# Patient Record
Sex: Female | Born: 1990 | Race: Black or African American | Hispanic: No | Marital: Single | Smoking: Former smoker
Health system: Southern US, Community
[De-identification: ages and names within clinical notes are randomized; demographics above are authoritative.]

## PROBLEM LIST (undated history)

## (undated) ENCOUNTER — Inpatient Hospital Stay: Payer: Self-pay

## (undated) DIAGNOSIS — F319 Bipolar disorder, unspecified: Secondary | ICD-10-CM

## (undated) DIAGNOSIS — F32A Depression, unspecified: Secondary | ICD-10-CM

## (undated) DIAGNOSIS — F419 Anxiety disorder, unspecified: Secondary | ICD-10-CM

## (undated) DIAGNOSIS — F329 Major depressive disorder, single episode, unspecified: Secondary | ICD-10-CM

## (undated) HISTORY — PX: NO PAST SURGERIES: SHX2092

---

## 2014-03-26 NOTE — L&D Delivery Note (Signed)
Delivery Summary for Baelynn Quakenbush  Labor Events:   Preterm labor:   Rupture date:   Rupture time:   Rupture type: Spontaneous  Fluid Color: Pink  Induction:   Augmentation:   Complications:   Cervical ripening:          Delivery:   Episiotomy:   Lacerations:   Repair suture:   Repair # of packets:   Blood loss (ml): 250   Information for the patient's newborn:  Enzo MontgomeryBellamy, Boy Janayah [[id number]][[id number]]    Delivery 02/20/2015 5:10 PM by  Vaginal, Vacuum (Extractor) Sex:  female Gestational Age: 358w2d Delivery Clinician:  Hildred LaserAnika Jerusha Reising Living?: Yes        APGARS  One minute Five minutes Ten minutes  Skin color: 0   1      Heart rate: 2   2      Grimace: 2   2      Muscle tone: 2   2      Breathing: 1   2      Totals: 7  9      Presentation/position: Vertex  Left Occiput Anterior Resuscitation: None  Cord information: 3 vessels   Disposition of cord blood: No    Blood gases sent? No Complications: None  Placenta: Delivered: 02/20/2015 5:17 PM  Spontaneous  Intact appearance Newborn Measurements: Weight: 6 lb 13.9 oz (3115 g)  Height: 19.49"  Head circumference:    Chest circumference:    Other providers: Registered Nurse Transition RN Daniel NonesLetitia S Elks Myrtha MantisKiara K Jacobs  Additional  information: Forceps:   Vacuum: Low  Breech:   Observed anomalies         Operative Delivery Note At 5:10 PM a viable and healthy female was delivered via Vaginal, Vacuum (Extractor).  Presentation: vertex; Position: Left,, Occiput,, Anterior; Station: +3.  Indications: terminal bradycardia (60s).  Number of pop-offs: 1  Verbal consent: obtained from patient.  Risks and benefits discussed in detail.  Risks include, but are not limited to the risks of anesthesia, bleeding, infection, damage to maternal tissues, fetal cephalhematoma.  There is also the risk of inability to effect vaginal delivery of the head, or shoulder dystocia that cannot be resolved by established maneuvers, leading to the need  for emergency cesarean section.  APGAR: 7, 9; weight 3115 grams.   Placenta status: Intact, Spontaneous.   Cord: 3 vessels with the following complications: None.  Cord pH: not obtained  Anesthesia: Epidural  Instruments: Kiwi Vacuum Extractor Episiotomy: None Lacerations: 1st degree Suture Repair: 2.0 Vicryl Est. Blood Loss (mL):  250   Mom to postpartum.  Baby to Couplet care / Skin to Skin.  Hildred Lasernika Jacaden Forbush 02/20/2015, 5:43 PM

## 2014-09-15 LAB — OB RESULTS CONSOLE VARICELLA ZOSTER ANTIBODY, IGG: Varicella: IMMUNE

## 2014-09-15 LAB — OB RESULTS CONSOLE RUBELLA ANTIBODY, IGM: RUBELLA: IMMUNE

## 2014-09-15 LAB — OB RESULTS CONSOLE ABO/RH: RH Type: POSITIVE

## 2014-09-15 LAB — OB RESULTS CONSOLE HGB/HCT, BLOOD
HCT: 34 %
HEMOGLOBIN: 11.4 g/dL

## 2014-09-15 LAB — OB RESULTS CONSOLE HEPATITIS B SURFACE ANTIGEN: HEP B S AG: NEGATIVE

## 2014-09-15 LAB — OB RESULTS CONSOLE PLATELET COUNT: Platelets: 176 10*3/uL

## 2014-09-15 LAB — OB RESULTS CONSOLE HIV ANTIBODY (ROUTINE TESTING): HIV: NONREACTIVE

## 2014-09-15 LAB — OB RESULTS CONSOLE RPR: RPR: NONREACTIVE

## 2014-10-21 LAB — OB RESULTS CONSOLE GC/CHLAMYDIA
Chlamydia: NEGATIVE
Gonorrhea: NEGATIVE

## 2014-12-23 ENCOUNTER — Encounter: Payer: Self-pay | Admitting: *Deleted

## 2014-12-23 ENCOUNTER — Observation Stay
Admission: EM | Admit: 2014-12-23 | Discharge: 2014-12-23 | Disposition: A | Discharge status: Home / Self Care | Payer: 59 | Attending: Obstetrics and Gynecology | Admitting: Obstetrics and Gynecology

## 2014-12-23 DIAGNOSIS — Z3A30 30 weeks gestation of pregnancy: Secondary | ICD-10-CM | POA: Insufficient documentation

## 2014-12-23 DIAGNOSIS — O210 Mild hyperemesis gravidarum: Principal | ICD-10-CM | POA: Insufficient documentation

## 2014-12-23 LAB — URINALYSIS COMPLETE WITH MICROSCOPIC (ARMC ONLY)
BILIRUBIN URINE: NEGATIVE
Bacteria, UA: NONE SEEN
GLUCOSE, UA: NEGATIVE mg/dL
Hgb urine dipstick: NEGATIVE
Ketones, ur: NEGATIVE mg/dL
Leukocytes, UA: NEGATIVE
Nitrite: NEGATIVE
PH: 8 (ref 5.0–8.0)
Protein, ur: NEGATIVE mg/dL
Specific Gravity, Urine: 1.011 (ref 1.005–1.030)

## 2014-12-23 MED ORDER — BISMUTH SUBSALICYLATE 262 MG/15ML PO SUSP
30.0000 mL | Freq: Once | ORAL | Status: AC
  Administered 2014-12-23: 30 mL via ORAL
  Filled 2014-12-23: qty 118

## 2014-12-23 MED ORDER — ONDANSETRON 4 MG PO TBDP
4.0000 mg | ORAL_TABLET | Freq: Once | ORAL | Status: AC
Start: 2014-12-23 — End: 2014-12-23
  Administered 2014-12-23: 4 mg via ORAL
  Filled 2014-12-23: qty 1

## 2014-12-23 NOTE — OB Triage Note (Signed)
Hyperemesis during entire pregnancy. Hospitalized 3 times this pregnancy due to dehydration. Rebecca Hickman

## 2014-12-23 NOTE — Discharge Instructions (Signed)
Drink plenty of fluids. Stay hydrated. Call OB office to set up your prenatal care.

## 2015-01-26 ENCOUNTER — Encounter: Payer: Self-pay | Admitting: *Deleted

## 2015-01-26 ENCOUNTER — Observation Stay: Payer: 59

## 2015-01-26 ENCOUNTER — Observation Stay
Admission: EM | Admit: 2015-01-26 | Discharge: 2015-01-26 | Disposition: A | Discharge status: Home / Self Care | Payer: 59 | Attending: Obstetrics and Gynecology | Admitting: Obstetrics and Gynecology

## 2015-01-26 DIAGNOSIS — R112 Nausea with vomiting, unspecified: Secondary | ICD-10-CM | POA: Insufficient documentation

## 2015-01-26 DIAGNOSIS — Z3A34 34 weeks gestation of pregnancy: Secondary | ICD-10-CM | POA: Diagnosis not present

## 2015-01-26 DIAGNOSIS — O26893 Other specified pregnancy related conditions, third trimester: Secondary | ICD-10-CM | POA: Insufficient documentation

## 2015-01-26 DIAGNOSIS — O4400 Placenta previa specified as without hemorrhage, unspecified trimester: Secondary | ICD-10-CM

## 2015-01-26 HISTORY — DX: Major depressive disorder, single episode, unspecified: F32.9

## 2015-01-26 HISTORY — DX: Depression, unspecified: F32.A

## 2015-01-26 HISTORY — DX: Anxiety disorder, unspecified: F41.9

## 2015-01-26 HISTORY — DX: Bipolar disorder, unspecified: F31.9

## 2015-01-26 LAB — URINALYSIS COMPLETE WITH MICROSCOPIC (ARMC ONLY)
BILIRUBIN URINE: NEGATIVE
Bacteria, UA: NONE SEEN
Glucose, UA: NEGATIVE mg/dL
Hgb urine dipstick: NEGATIVE
KETONES UR: NEGATIVE mg/dL
Leukocytes, UA: NEGATIVE
NITRITE: NEGATIVE
PH: 8 (ref 5.0–8.0)
PROTEIN: NEGATIVE mg/dL
RBC / HPF: NONE SEEN RBC/hpf (ref 0–5)
SPECIFIC GRAVITY, URINE: 1.013 (ref 1.005–1.030)

## 2015-01-26 LAB — URINE DRUG SCREEN, QUALITATIVE (ARMC ONLY)
Amphetamines, Ur Screen: NOT DETECTED
BARBITURATES, UR SCREEN: NOT DETECTED
BENZODIAZEPINE, UR SCRN: NOT DETECTED
Cannabinoid 50 Ng, Ur ~~LOC~~: POSITIVE — AB
Cocaine Metabolite,Ur ~~LOC~~: NOT DETECTED
MDMA (Ecstasy)Ur Screen: NOT DETECTED
Methadone Scn, Ur: NOT DETECTED
OPIATE, UR SCREEN: NOT DETECTED
PHENCYCLIDINE (PCP) UR S: NOT DETECTED
Tricyclic, Ur Screen: NOT DETECTED

## 2015-01-26 MED ORDER — RANITIDINE HCL 150 MG PO TABS: 150.0000 mg | ORAL_TABLET | Freq: Two times a day (BID) | ORAL | Status: DC

## 2015-01-26 MED ORDER — TERBUTALINE SULFATE 1 MG/ML IJ SOLN
0.2500 mg | Freq: Once | INTRAMUSCULAR | Status: AC
  Administered 2015-01-26: 0.25 mg via SUBCUTANEOUS
  Filled 2015-01-26: qty 1

## 2015-01-26 MED ORDER — ONDANSETRON 4 MG PO TBDP: 4.0000 mg | ORAL_TABLET | Freq: Four times a day (QID) | ORAL | Status: DC | PRN

## 2015-01-26 NOTE — Discharge Instructions (Signed)
Please make an appointment with [facility] for next week (11/7)

## 2015-01-26 NOTE — OB Triage Note (Signed)
Complaining of increased nausea and vomiting for the last week with "stomach pains".
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
## 2015-01-26 NOTE — Final Progress Note (Signed)
Physician Final Progress Note  Patient ID: Rebecca Hickman Michelle MRN: [id number] DOB/AGE: Jun 08, 1990 24 y.o.  Admit date: 01/26/2015 Admitting provider: Herold HarmsMartin A Defrancesco, MD Discharge date: 01/26/2015   Admission Diagnoses:  1. 34.5 week intrauterine pregnancy 2. Nausea and vomiting 3. Preterm contractions  Discharge Diagnoses:  1. 34.5 week intrauterine pregnancy, undelivered 2. Nausea and vomiting 3. Preterm contractions   24 year old African-American female gravida 1 para 0 at 2734.[redacted] weeks gestation, with no regular prenatal care, [facility], currently homeless, presents to labor and delivery with complaint of nausea/vomiting and abdominal cramping. Patient had early ultrasound at 20 weeks which demonstrated marginal previa. Ultrasound today demonstrates no placenta previa. Fetal monitoring demonstrates irregular contractions every 5-10 minutes, mild Cervix is 1-2 cm/70%/0/vertex/BOWI Patient is given by mouth fluids and terbutaline subcutaneous 1 dose for suppression of uterine irritability. Patient is [facility] of prenatal care on 02/01/2015.  Consults: None  Significant Findings/ Diagnostic Studies: Ultrasound-fundal placenta without evidence of previa  Procedures:  NST-reactive Pelvic exam: 1-2 cm/70%/0/vertex/BOWI  Discharge Condition: good  Disposition: 01-Home or Self Care  Diet: Regular diet  Discharge Activity: Activity as tolerated  Discharge Instructions    Discharge patient    Complete by:  As directed             Medication List    TAKE these medications        ondansetron 4 MG disintegrating tablet  Commonly known as:  ZOFRAN ODT  Take 1 tablet (4 mg total) by mouth every 6 (six) hours as needed for nausea.     PRENATAL VITAMIN PO  Take by mouth.     ranitidine 150 MG tablet  Commonly known as:  ZANTAC  Take 1 tablet (150 mg total) by mouth  2 (two) times daily.           Follow-up Information    Follow up with Herold HarmsMartin A Defrancesco, MD. Schedule an appointment as soon as possible for a visit on 02/01/2015.   Specialties:  Obstetrics and Gynecology, Radiology   Why:  New OB visit   Contact information:   [address] [place] [id number] [phone]        Signed: Prentice DockerMartin A Defrancesco 01/26/2015, 4:59 PM

## 2015-01-26 NOTE — Discharge Planning (Signed)
Discharge instructions given and explained.  Verbalized understanding.  Signed copy to chart and one in hand.  Will remain on monitor until her ride arrives to take her home.

## 2015-01-26 NOTE — OB Triage Note (Signed)
Recvd with c/o n/v and abdominal pain that worsened this AM.  Changed to gown and to bed.  EFM applied.  Plan of care discussed and oriented to room.  Verbalized understanding and agreement with plan.

## 2015-01-29 LAB — CULTURE, BETA STREP (GROUP B ONLY)

## 2015-02-01 ENCOUNTER — Encounter: Payer: Self-pay | Admitting: *Deleted

## 2015-02-01 ENCOUNTER — Ambulatory Visit (INDEPENDENT_AMBULATORY_CARE_PROVIDER_SITE_OTHER): Payer: 59 | Admitting: Obstetrics and Gynecology

## 2015-02-01 ENCOUNTER — Inpatient Hospital Stay: Admit: 2015-02-01 | Payer: Self-pay

## 2015-02-01 ENCOUNTER — Inpatient Hospital Stay
Admission: EM | Admit: 2015-02-01 | Discharge: 2015-02-02 | Disposition: A | Discharge status: Home / Self Care | Payer: 59 | Attending: Obstetrics and Gynecology | Admitting: Obstetrics and Gynecology

## 2015-02-01 ENCOUNTER — Encounter: Payer: Self-pay | Admitting: Obstetrics and Gynecology

## 2015-02-01 VITALS — BP 103/68 | HR 85 | Wt 132.1 lb

## 2015-02-01 DIAGNOSIS — Z3490 Encounter for supervision of normal pregnancy, unspecified, unspecified trimester: Secondary | ICD-10-CM

## 2015-02-01 DIAGNOSIS — Z3403 Encounter for supervision of normal first pregnancy, third trimester: Secondary | ICD-10-CM

## 2015-02-01 DIAGNOSIS — Z113 Encounter for screening for infections with a predominantly sexual mode of transmission: Secondary | ICD-10-CM

## 2015-02-01 DIAGNOSIS — Z5900 Homelessness unspecified: Secondary | ICD-10-CM

## 2015-02-01 DIAGNOSIS — Z131 Encounter for screening for diabetes mellitus: Secondary | ICD-10-CM | POA: Diagnosis not present

## 2015-02-01 DIAGNOSIS — Z1389 Encounter for screening for other disorder: Secondary | ICD-10-CM

## 2015-02-01 DIAGNOSIS — Z3A36 36 weeks gestation of pregnancy: Secondary | ICD-10-CM | POA: Insufficient documentation

## 2015-02-01 LAB — OB RESULTS CONSOLE GBS: STREP GROUP B AG: NEGATIVE

## 2015-02-01 LAB — POCT URINALYSIS DIPSTICK
BILIRUBIN UA: NEGATIVE
Blood, UA: NEGATIVE
Glucose, UA: NEGATIVE
KETONES UA: NEGATIVE
LEUKOCYTES UA: NEGATIVE
NITRITE UA: NEGATIVE
PH UA: 6
Protein, UA: NEGATIVE
Spec Grav, UA: 1.02
Urobilinogen, UA: NEGATIVE

## 2015-02-01 LAB — OB RESULTS CONSOLE GC/CHLAMYDIA: Chlamydia: NEGATIVE

## 2015-02-01 MED ORDER — TERBUTALINE SULFATE 1 MG/ML IJ SOLN
0.2500 mg | Freq: Once | INTRAMUSCULAR | Status: AC
  Administered 2015-02-02: 0.25 mg via SUBCUTANEOUS

## 2015-02-01 NOTE — Progress Notes (Deleted)
ER f/u for n/v, preterm contractions. Some contractions, noted front of stomach. Morning N&V. NONSTRESS TEST INTERPRETATION  INDICATIONS: {Blank multiple:19196::"Decreased fetal movement","Chronic hypertension","Preeclampsia," GDM","Obesity"," AMA"," IUGR","***"}  FHR baseline: *** RESULTS: reactive COMMENTS: ***   PLAN: 1. Continue fetal kick counts twice a day. 2. Continue antepartum testing as scheduled-weekly 3.  F/U weekly  Fenton Mallingebbie Narcissus Detwiler, LPN

## 2015-02-01 NOTE — Progress Notes (Signed)
New OB intake: 24 year old single African-American fem gravida 1, para 0 at 35.[redacted] weeks gestation, presents for new OB intake evaluation.  She was seen in labor and delivery last week for nausea, vomiting in the third trimester.  She was homeless [place].  Currently, she does have a  Place to stay.  She reports good fetal movement.  She had prenatal care early in her pregnancy.  At no clots.  [place].  She has no known active problems.  Noted marginalhas resolved on most recent ultrasound one week ago. Past medical history: No chronic illnesses. Past surgical history: None. Family history: Negative. Social history: No tobacco use; no alcohol use; no drug use.  Patient currently staying in homeless shelter. Lab work: A+/antibody screen negative/RPR nonreactive/hepatitis B negative/HIV negative/rubella immune/Varicela immune/urine culture negative/hemoglobin electrophoresis- normal hemoglobin//GBS negative (culture last week). PLAN: 1.  36 week labs today. 2.  NST-Reactive 3.  One hour Glucola. 4.  Follow up weekly.

## 2015-02-02 DIAGNOSIS — Z3A36 36 weeks gestation of pregnancy: Secondary | ICD-10-CM | POA: Diagnosis not present

## 2015-02-02 LAB — GLUCOSE, 1 HOUR GESTATIONAL: Gestational Diabetes Screen: 103 mg/dL (ref 65–139)

## 2015-02-02 MED ORDER — TERBUTALINE SULFATE 1 MG/ML IJ SOLN
INTRAMUSCULAR | Status: AC
  Administered 2015-02-02: 0.25 mg via SUBCUTANEOUS
  Filled 2015-02-02: qty 1

## 2015-02-02 NOTE — Discharge Summary (Signed)
Discharge instructions reviewed with patient including follow up appointments, signs of preterm labor and when to seek medical attention. All questions answered. Patient discharged in stable condition without signs of distress.

## 2015-02-03 LAB — GC/CHLAMYDIA PROBE AMP
Chlamydia trachomatis, NAA: NEGATIVE
NEISSERIA GONORRHOEAE BY PCR: NEGATIVE

## 2015-02-06 ENCOUNTER — Observation Stay
Admission: EM | Admit: 2015-02-06 | Discharge: 2015-02-06 | Disposition: A | Discharge status: Home / Self Care | Payer: 59 | Attending: Obstetrics and Gynecology | Admitting: Obstetrics and Gynecology

## 2015-02-06 DIAGNOSIS — Z3A36 36 weeks gestation of pregnancy: Secondary | ICD-10-CM | POA: Diagnosis not present

## 2015-02-06 NOTE — OB Triage Note (Signed)
Pt. reports lots of fetal movement and contractions "all day." Denies LOF or vaginal bleeding.

## 2015-02-07 NOTE — Final Progress Note (Signed)
L&D OB Triage Note  SUBJECTIVE Rebecca Hickman is a 24 y.o. G1P0000 female at 6041w3d, EDD Estimated Date of Delivery: 03/04/15 who presented to triage with complaints of contractions.    OBJECTIVE Nursing Evaluation: BP 111/66 mmHg  Pulse 103  Temp(Src) 97.8 F (36.6 C) (Oral)  Resp 14 no significant findings-irregular contractions on monitor; patient declined exam.  NST was performed and has been reviewed by me.  NST INTERPRETATION: Indications: rule out uterine contractions  Mode: External Baseline Rate (A): 130 bpm Variability: Moderate Accelerations: 15 x 15 Decelerations: None     Contraction Frequency (min): irregular  ASSESSMENT Impression:  1. Pregnancy:  G1P0000 at 4941w3d , EDD Estimated Date of Delivery: 03/04/15 2.  Reactive NST  PLAN 1. Reassurance given 2. Discharge home with bleeding/labor precautions.  3. Continue routine prenatal care.   Herold HarmsMartin A Kebra Lowrimore, MD

## 2015-02-08 ENCOUNTER — Ambulatory Visit (INDEPENDENT_AMBULATORY_CARE_PROVIDER_SITE_OTHER): Payer: 59 | Admitting: Obstetrics and Gynecology

## 2015-02-08 ENCOUNTER — Encounter: Payer: Self-pay | Admitting: Obstetrics and Gynecology

## 2015-02-08 VITALS — BP 108/70 | HR 87 | Wt 136.0 lb

## 2015-02-08 DIAGNOSIS — Z3493 Encounter for supervision of normal pregnancy, unspecified, third trimester: Secondary | ICD-10-CM

## 2015-02-08 LAB — POCT URINALYSIS DIPSTICK
BILIRUBIN UA: NEGATIVE
GLUCOSE UA: NEGATIVE
Nitrite, UA: NEGATIVE
Protein, UA: NEGATIVE
RBC UA: NEGATIVE
SPEC GRAV UA: 1.015
UROBILINOGEN UA: 0.2
pH, UA: 7

## 2015-02-08 NOTE — Progress Notes (Signed)
Declines u/a and exam.- Pt is very upset. Wants to be induced. States she cant feed her baby. Denied food stamps. Pt given number to [facility]. Labor precautions. Will see if pt qualifies for Pregnancy Medical Home as [place].

## 2015-02-10 ENCOUNTER — Encounter: Payer: 59 | Admitting: Obstetrics and Gynecology

## 2015-02-16 ENCOUNTER — Ambulatory Visit (INDEPENDENT_AMBULATORY_CARE_PROVIDER_SITE_OTHER): Payer: 59 | Admitting: Obstetrics and Gynecology

## 2015-02-16 ENCOUNTER — Observation Stay
Admission: EM | Admit: 2015-02-16 | Discharge: 2015-02-17 | Disposition: A | Discharge status: Home / Self Care | Payer: 59 | Source: Home / Self Care | Admitting: Obstetrics and Gynecology

## 2015-02-16 ENCOUNTER — Encounter: Payer: Self-pay | Admitting: Obstetrics and Gynecology

## 2015-02-16 VITALS — BP 111/69 | HR 106 | Wt 138.4 lb

## 2015-02-16 DIAGNOSIS — R109 Unspecified abdominal pain: Secondary | ICD-10-CM | POA: Insufficient documentation

## 2015-02-16 DIAGNOSIS — O26893 Other specified pregnancy related conditions, third trimester: Secondary | ICD-10-CM | POA: Insufficient documentation

## 2015-02-16 DIAGNOSIS — Z3490 Encounter for supervision of normal pregnancy, unspecified, unspecified trimester: Secondary | ICD-10-CM

## 2015-02-16 DIAGNOSIS — Z3493 Encounter for supervision of normal pregnancy, unspecified, third trimester: Secondary | ICD-10-CM

## 2015-02-16 DIAGNOSIS — Z3A38 38 weeks gestation of pregnancy: Secondary | ICD-10-CM | POA: Insufficient documentation

## 2015-02-16 LAB — POCT URINALYSIS DIPSTICK
Bilirubin, UA: NEGATIVE
GLUCOSE UA: NEGATIVE
Ketones, UA: NEGATIVE
NITRITE UA: NEGATIVE
PROTEIN UA: NEGATIVE
RBC UA: NEGATIVE
Spec Grav, UA: 1.01
UROBILINOGEN UA: 0.2
pH, UA: 7.5

## 2015-02-16 NOTE — OB Triage Note (Signed)
Pt arrived via ED with abdominal pain and irregular contractions. [facility] and questions addressed. Will cont to update.

## 2015-02-16 NOTE — Progress Notes (Signed)
Declines pelvic exam. Labor precautions.

## 2015-02-17 DIAGNOSIS — Z3490 Encounter for supervision of normal pregnancy, unspecified, unspecified trimester: Secondary | ICD-10-CM

## 2015-02-17 NOTE — Discharge Instructions (Signed)
Pt education given. Please drink plenty of water and get plenty of rest.

## 2015-02-19 ENCOUNTER — Encounter: Payer: Self-pay | Admitting: *Deleted

## 2015-02-19 ENCOUNTER — Observation Stay
Admission: EM | Admit: 2015-02-19 | Discharge: 2015-02-20 | Disposition: A | Discharge status: Home / Self Care | Payer: 59 | Source: Home / Self Care | Admitting: Obstetrics and Gynecology

## 2015-02-19 DIAGNOSIS — Z3A38 38 weeks gestation of pregnancy: Secondary | ICD-10-CM

## 2015-02-19 DIAGNOSIS — O26893 Other specified pregnancy related conditions, third trimester: Secondary | ICD-10-CM

## 2015-02-19 DIAGNOSIS — R109 Unspecified abdominal pain: Secondary | ICD-10-CM | POA: Insufficient documentation

## 2015-02-20 ENCOUNTER — Inpatient Hospital Stay: Payer: 59 | Admitting: Anesthesiology

## 2015-02-20 ENCOUNTER — Encounter: Payer: Self-pay | Admitting: *Deleted

## 2015-02-20 ENCOUNTER — Encounter: Payer: Self-pay | Admitting: Anesthesiology

## 2015-02-20 ENCOUNTER — Inpatient Hospital Stay
Admission: EM | Admit: 2015-02-20 | Discharge: 2015-02-22 | DRG: 775 | Disposition: A | Discharge status: Home / Self Care | Payer: 59 | Attending: Obstetrics and Gynecology | Admitting: Obstetrics and Gynecology

## 2015-02-20 DIAGNOSIS — Z818 Family history of other mental and behavioral disorders: Secondary | ICD-10-CM

## 2015-02-20 DIAGNOSIS — Z87891 Personal history of nicotine dependence: Secondary | ICD-10-CM | POA: Diagnosis not present

## 2015-02-20 DIAGNOSIS — O9902 Anemia complicating childbirth: Secondary | ICD-10-CM | POA: Diagnosis present

## 2015-02-20 DIAGNOSIS — Z3A38 38 weeks gestation of pregnancy: Secondary | ICD-10-CM

## 2015-02-20 DIAGNOSIS — Z5900 Homelessness unspecified: Secondary | ICD-10-CM

## 2015-02-20 DIAGNOSIS — D696 Thrombocytopenia, unspecified: Secondary | ICD-10-CM | POA: Diagnosis present

## 2015-02-20 DIAGNOSIS — O4292 Full-term premature rupture of membranes, unspecified as to length of time between rupture and onset of labor: Secondary | ICD-10-CM | POA: Diagnosis present

## 2015-02-20 DIAGNOSIS — Z3490 Encounter for supervision of normal pregnancy, unspecified, unspecified trimester: Secondary | ICD-10-CM

## 2015-02-20 DIAGNOSIS — O9912 Other diseases of the blood and blood-forming organs and certain disorders involving the immune mechanism complicating childbirth: Secondary | ICD-10-CM | POA: Diagnosis present

## 2015-02-20 DIAGNOSIS — R109 Unspecified abdominal pain: Secondary | ICD-10-CM | POA: Diagnosis present

## 2015-02-20 LAB — URINALYSIS COMPLETE WITH MICROSCOPIC (ARMC ONLY)
Bacteria, UA: NONE SEEN
Bilirubin Urine: NEGATIVE
Glucose, UA: NEGATIVE mg/dL
KETONES UR: NEGATIVE mg/dL
Leukocytes, UA: NEGATIVE
NITRITE: NEGATIVE
PH: 8 (ref 5.0–8.0)
PROTEIN: NEGATIVE mg/dL
SPECIFIC GRAVITY, URINE: 1.003 — AB (ref 1.005–1.030)

## 2015-02-20 LAB — CHLAMYDIA/NGC RT PCR (ARMC ONLY)
Chlamydia Tr: NOT DETECTED
N GONORRHOEAE: NOT DETECTED

## 2015-02-20 LAB — CBC
HCT: 31.3 % — ABNORMAL LOW (ref 35.0–47.0)
Hemoglobin: 10.6 g/dL — ABNORMAL LOW (ref 12.0–16.0)
MCH: 30.7 pg (ref 26.0–34.0)
MCHC: 33.7 g/dL (ref 32.0–36.0)
MCV: 90.9 fL (ref 80.0–100.0)
PLATELETS: 143 10*3/uL — AB (ref 150–440)
RBC: 3.45 MIL/uL — AB (ref 3.80–5.20)
RDW: 13.1 % (ref 11.5–14.5)
WBC: 14.4 10*3/uL — AB (ref 3.6–11.0)

## 2015-02-20 LAB — OB RESULTS CONSOLE GBS: STREP GROUP B AG: NEGATIVE

## 2015-02-20 LAB — ABO/RH: ABO/RH(D): A POS

## 2015-02-20 LAB — TYPE AND SCREEN
ABO/RH(D): A POS
Antibody Screen: NEGATIVE

## 2015-02-20 MED ORDER — SCOPOLAMINE 1 MG/3DAYS TD PT72
1.0000 | MEDICATED_PATCH | Freq: Once | TRANSDERMAL | Status: DC
  Filled 2015-02-20: qty 1

## 2015-02-20 MED ORDER — LACTATED RINGERS IV SOLN
INTRAVENOUS | Status: DC
  Administered 2015-02-20 (×2): via INTRAVENOUS

## 2015-02-20 MED ORDER — TERBUTALINE SULFATE 1 MG/ML IJ SOLN: 0.2500 mg | Freq: Once | INTRAMUSCULAR | Status: DC | PRN

## 2015-02-20 MED ORDER — WITCH HAZEL-GLYCERIN EX PADS: 1.0000 "application " | MEDICATED_PAD | CUTANEOUS | Status: DC | PRN

## 2015-02-20 MED ORDER — LANOLIN HYDROUS EX OINT: TOPICAL_OINTMENT | CUTANEOUS | Status: DC | PRN

## 2015-02-20 MED ORDER — ACETAMINOPHEN 325 MG PO TABS: 650.0000 mg | ORAL_TABLET | ORAL | Status: DC | PRN

## 2015-02-20 MED ORDER — SIMETHICONE 80 MG PO CHEW: 80.0000 mg | CHEWABLE_TABLET | ORAL | Status: DC | PRN

## 2015-02-20 MED ORDER — DIPHENHYDRAMINE HCL 25 MG PO CAPS: 25.0000 mg | ORAL_CAPSULE | ORAL | Status: DC | PRN

## 2015-02-20 MED ORDER — ONDANSETRON HCL 4 MG/2ML IJ SOLN: 4.0000 mg | INTRAMUSCULAR | Status: DC | PRN

## 2015-02-20 MED ORDER — FENTANYL 2.5 MCG/ML W/ROPIVACAINE 0.2% IN NS 100 ML EPIDURAL INFUSION (ARMC-ANES)
EPIDURAL | Status: AC
  Administered 2015-02-20: 10 mL/h via EPIDURAL
  Filled 2015-02-20: qty 100

## 2015-02-20 MED ORDER — NALBUPHINE HCL 10 MG/ML IJ SOLN
5.0000 mg | Freq: Once | INTRAMUSCULAR | Status: DC | PRN
  Filled 2015-02-20: qty 0.5

## 2015-02-20 MED ORDER — OXYTOCIN 40 UNITS IN LACTATED RINGERS INFUSION - SIMPLE MED
62.5000 mL/h | INTRAVENOUS | Status: DC
  Filled 2015-02-20: qty 1000

## 2015-02-20 MED ORDER — SODIUM CHLORIDE 0.9 % IJ SOLN: 3.0000 mL | INTRAMUSCULAR | Status: DC | PRN

## 2015-02-20 MED ORDER — NALOXONE HCL 0.4 MG/ML IJ SOLN: 0.4000 mg | INTRAMUSCULAR | Status: DC | PRN

## 2015-02-20 MED ORDER — PRENATAL MULTIVITAMIN CH
1.0000 | ORAL_TABLET | Freq: Every day | ORAL | Status: DC
  Administered 2015-02-21: 1 via ORAL
  Filled 2015-02-20 (×2): qty 1

## 2015-02-20 MED ORDER — CITRIC ACID-SODIUM CITRATE 334-500 MG/5ML PO SOLN: 30.0000 mL | ORAL | Status: DC | PRN

## 2015-02-20 MED ORDER — IBUPROFEN 800 MG PO TABS
ORAL_TABLET | ORAL | Status: AC
  Administered 2015-02-20: 800 mg via ORAL
  Filled 2015-02-20: qty 1

## 2015-02-20 MED ORDER — MISOPROSTOL 200 MCG PO TABS
ORAL_TABLET | ORAL | Status: AC
  Filled 2015-02-20: qty 4

## 2015-02-20 MED ORDER — ONDANSETRON HCL 4 MG/2ML IJ SOLN: 4.0000 mg | Freq: Four times a day (QID) | INTRAMUSCULAR | Status: DC | PRN

## 2015-02-20 MED ORDER — OXYTOCIN BOLUS FROM INFUSION: 500.0000 mL | INTRAVENOUS | Status: DC

## 2015-02-20 MED ORDER — LACTATED RINGERS IV SOLN: 500.0000 mL | INTRAVENOUS | Status: DC | PRN

## 2015-02-20 MED ORDER — NALOXONE HCL 2 MG/2ML IJ SOSY
1.0000 ug/kg/h | PREFILLED_SYRINGE | INTRAVENOUS | Status: DC | PRN
  Filled 2015-02-20: qty 2

## 2015-02-20 MED ORDER — DIPHENHYDRAMINE HCL 50 MG/ML IJ SOLN: 12.5000 mg | INTRAMUSCULAR | Status: DC | PRN

## 2015-02-20 MED ORDER — LIDOCAINE HCL (PF) 1 % IJ SOLN
INTRAMUSCULAR | Status: AC
  Filled 2015-02-20: qty 30

## 2015-02-20 MED ORDER — OXYCODONE-ACETAMINOPHEN 5-325 MG PO TABS: 2.0000 | ORAL_TABLET | ORAL | Status: DC | PRN

## 2015-02-20 MED ORDER — BENZOCAINE-MENTHOL 20-0.5 % EX AERO
1.0000 "application " | INHALATION_SPRAY | CUTANEOUS | Status: DC | PRN
  Administered 2015-02-21 – 2015-02-22 (×2): 1 via TOPICAL
  Filled 2015-02-20 (×2): qty 56

## 2015-02-20 MED ORDER — NALBUPHINE HCL 10 MG/ML IJ SOLN
5.0000 mg | INTRAMUSCULAR | Status: DC | PRN
  Filled 2015-02-20: qty 0.5

## 2015-02-20 MED ORDER — ONDANSETRON HCL 4 MG PO TABS: 4.0000 mg | ORAL_TABLET | ORAL | Status: DC | PRN

## 2015-02-20 MED ORDER — DOCUSATE SODIUM 100 MG PO CAPS
100.0000 mg | ORAL_CAPSULE | Freq: Two times a day (BID) | ORAL | Status: DC
  Administered 2015-02-21 (×3): 100 mg via ORAL
  Filled 2015-02-20 (×4): qty 1

## 2015-02-20 MED ORDER — MEPERIDINE HCL 25 MG/ML IJ SOLN: 6.2500 mg | INTRAMUSCULAR | Status: DC | PRN

## 2015-02-20 MED ORDER — OXYTOCIN 10 UNIT/ML IJ SOLN
INTRAMUSCULAR | Status: AC
  Filled 2015-02-20: qty 2

## 2015-02-20 MED ORDER — AMMONIA AROMATIC IN INHA
RESPIRATORY_TRACT | Status: AC
  Filled 2015-02-20: qty 10

## 2015-02-20 MED ORDER — OXYTOCIN 40 UNITS IN LACTATED RINGERS INFUSION - SIMPLE MED
1.0000 m[IU]/min | INTRAVENOUS | Status: DC
  Administered 2015-02-20: 1 m[IU]/min via INTRAVENOUS

## 2015-02-20 MED ORDER — KETOROLAC TROMETHAMINE 30 MG/ML IJ SOLN: 30.0000 mg | Freq: Four times a day (QID) | INTRAMUSCULAR | Status: DC | PRN

## 2015-02-20 MED ORDER — DIBUCAINE 1 % RE OINT: 1.0000 "application " | TOPICAL_OINTMENT | RECTAL | Status: DC | PRN

## 2015-02-20 MED ORDER — DIPHENHYDRAMINE HCL 25 MG PO CAPS: 25.0000 mg | ORAL_CAPSULE | Freq: Four times a day (QID) | ORAL | Status: DC | PRN

## 2015-02-20 MED ORDER — BUTORPHANOL TARTRATE 1 MG/ML IJ SOLN
1.0000 mg | Freq: Once | INTRAMUSCULAR | Status: AC
  Administered 2015-02-20: 1 mg via INTRAVENOUS
  Filled 2015-02-20: qty 1

## 2015-02-20 MED ORDER — OXYCODONE-ACETAMINOPHEN 5-325 MG PO TABS: 1.0000 | ORAL_TABLET | ORAL | Status: DC | PRN

## 2015-02-20 MED ORDER — ZOLPIDEM TARTRATE 5 MG PO TABS: 5.0000 mg | ORAL_TABLET | Freq: Every evening | ORAL | Status: DC | PRN

## 2015-02-20 MED ORDER — IBUPROFEN 800 MG PO TABS
800.0000 mg | ORAL_TABLET | Freq: Four times a day (QID) | ORAL | Status: DC
  Administered 2015-02-20 – 2015-02-22 (×6): 800 mg via ORAL
  Filled 2015-02-20 (×5): qty 1

## 2015-02-20 MED ORDER — OXYCODONE-ACETAMINOPHEN 5-325 MG PO TABS
1.0000 | ORAL_TABLET | ORAL | Status: DC | PRN
  Administered 2015-02-20 – 2015-02-22 (×9): 1 via ORAL
  Filled 2015-02-20 (×9): qty 1

## 2015-02-20 MED ORDER — BUPIVACAINE HCL (PF) 0.25 % IJ SOLN
INTRAMUSCULAR | Status: DC | PRN
  Administered 2015-02-20: 5 mL via EPIDURAL

## 2015-02-20 MED ORDER — ONDANSETRON HCL 4 MG/2ML IJ SOLN: 4.0000 mg | Freq: Three times a day (TID) | INTRAMUSCULAR | Status: DC | PRN

## 2015-02-20 NOTE — Progress Notes (Signed)
Pt found to have left room when nurse returned with w/c- no belongings in room. Pt declining written d/c instructions earlier but agreeable to keeping appointment- voicing understanding of verbal instructions 0130. Pt did not sign d/c papers. Stated she planned on returning [place] [facility] [place]

## 2015-02-20 NOTE — Progress Notes (Signed)
Pt calling friend again, declining call to owner of residence who accompanied her to hospital for ride. Pt stating friend will be here in 10 min or so. Pt requesting w/c to ER as she is tired.

## 2015-02-20 NOTE — H&P (Addendum)
Obstetric History and Physical  Rebecca Hickman is a 24 y.o. G1P0000 with IUP at 7944w2d presenting for PROM since 10 pm yesterday evening. Patient states she has been having  irregular, every 4-7 minutes contractions, none vaginal bleeding, ruptured, clear fluid membranes, with active fetal movement.    Prenatal Course Source of Care: [facility] with onset of care at 35 weeks (late entry to care)   Pregnancy complications or risks: Patient Active Problem List   Diagnosis Date Noted  . Abdominal pain 02/20/2015  . Homelessness 02/20/2015  . Pregnancy 02/17/2015  . Indication for care in labor or delivery 12/23/2014   She plans to breastfeed She desires undecided for postpartum contraception.   Prenatal labs and studies: ABO, Rh: --/--/PENDING (11/27 16100844) Antibody: PENDING (11/27 0844) Rubella: Immune (06/22 0000) RPR: Nonreactive (06/22 0000)  HBsAg: Negative (06/22 0000)  HIV: Non-reactive (06/22 0000)  RUE:AVWUJWJXGBS:Negative (11/27 0000) 1 hr Glucola  normal Genetic screening advanced gestation,  unable to perform Anatomy US normal  Prenatal Transfer Tool  Maternal Diabetes: No Genetic Screening: Declined Maternal Ultrasounds/Referrals: Normal Fetal Ultrasounds or other Referrals:  None Maternal Substance Abuse:  No Significant Maternal Medications:  None Significant Maternal Lab Results: None  Past Medical History  Diagnosis Date  . Anxiety     PTSD  . Bipolar 1 disorder (HCC)   . Depression     Past Surgical History  Procedure Laterality Date  . No past surgeries      OB History  Gravida Para Term Preterm AB SAB TAB Ectopic Multiple Living  1 0 0 0 0 0 0 0 0 0     # Outcome Date GA Lbr Len/2nd Weight Sex Delivery Anes PTL Lv  1 Current               Social History   Social History  . Marital Status: Single    Spouse Name: N/A  . Number of Children: N/A  . Years of Education: N/A   Social History Main Topics  . Smoking status: Former Smoker     Quit date: 05/26/2014  . Smokeless tobacco: Never Used  . Alcohol Use: No  . Drug Use: No     Comment: states she recently quit using MJ  . Sexual Activity: No   Other Topics Concern  . None   Social History Narrative    Family History  Problem Relation Age of Onset  . Mental illness Mother     schizophrenia-fob side of family    Prescriptions prior to admission  Medication Sig Dispense Refill Last Dose  . Prenatal Vit-Fe Fumarate-FA (PRENATAL VITAMIN PO) Take by mouth.   02/19/2015 at Unknown time    No Known Allergies  Review of Systems: Negative except for what is mentioned in HPI.  Physical Exam: BP 100/59 mmHg  Pulse 87  Temp(Src) 98.2 F (36.8 C) (Oral)  Resp 20  Ht 5\' 7"  (1.702 m)  Wt 140 lb (63.504 kg)  BMI 21.92 kg/m2 CONSTITUTIONAL: Well-developed, well-nourished female in no acute distress.  HENT:  Normocephalic, atraumatic, External right and left ear normal. Oropharynx is clear and moist EYES: Conjunctivae and EOM are normal. Pupils are equal, round, and reactive to light. No scleral icterus.  NECK: Normal range of motion, supple, no masses SKIN: Skin is warm and dry. No rash noted. Not diaphoretic. No erythema. No pallor. NEUROLGIC: Alert and oriented to person, place, and time. Normal reflexes, muscle tone coordination. No cranial nerve deficit noted. PSYCHIATRIC: Normal mood and affect.  Normal behavior. Normal judgment and thought content. CARDIOVASCULAR: Normal heart rate noted, regular rhythm RESPIRATORY: Effort and breath sounds normal, no problems with respiration noted ABDOMEN: Soft, nontender, nondistended, gravid. MUSCULOSKELETAL: Normal range of motion. No edema and no tenderness. 2+ distal pulses.  Cervical Exam: Dilatation 3cm   Effacement 80%   Station -2   Presentation: cephalic FHT:  Baseline rate 145 bpm   Variability moderate  Accelerations present   Decelerations none Contractions: Every 4-7 mins   Pertinent Labs/Studies:    Results for orders placed or performed during the hospital encounter of 02/20/15 (from the past 24 hour(s))  OB RESULT CONSOLE Group B Strep     Status: None   Collection Time: 02/20/15 12:00 AM  Result Value Ref Range   GBS Negative   Chlamydia/NGC rt PCR ([facility] only)     Status: None   Collection Time: 02/20/15  5:48 AM  Result Value Ref Range   Specimen source GC/Chlam URINE, RANDOM    Chlamydia Tr NOT DETECTED NOT DETECTED   N gonorrhoeae NOT DETECTED NOT DETECTED  CBC     Status: Abnormal   Collection Time: 02/20/15  5:49 AM  Result Value Ref Range   WBC 14.4 (H) 3.6 - 11.0 K/uL   RBC 3.45 (L) 3.80 - 5.20 MIL/uL   Hemoglobin 10.6 (L) 12.0 - 16.0 g/dL   HCT 21.3 (L) 08.6 - 57.8 %   MCV 90.9 80.0 - 100.0 fL   MCH 30.7 26.0 - 34.0 pg   MCHC 33.7 32.0 - 36.0 g/dL   RDW 46.9 62.9 - 52.8 %   Platelets 143 (L) 150 - 440 K/uL  Type and screen     Status: None (Preliminary result)   Collection Time: 02/20/15  8:44 AM  Result Value Ref Range   ABO/RH(D) PENDING    Antibody Screen PENDING    Sample Expiration 02/23/2015     Assessment : Rebecca Hickman is a 24 y.o. G1P0000 at [redacted]w[redacted]d being admitted for PROM.  Plan: Labor: Active management.  Augmentation with Pitocin per protocol FWB: Reassuring fetal heart tracing.  GBS negative Delivery plan: Hopeful for vaginal delivery Will need social services consult postpartum for h/o homelessness.  Mild thrombocytopenia, likely gestational.  Will f/u postpartum.   Hildred Laser, MD [facility]

## 2015-02-20 NOTE — Anesthesia Preprocedure Evaluation (Deleted)
Anesthesia Evaluation  Patient identified by MRN, date of birth, ID band Patient awake    Reviewed: Allergy & Precautions, NPO status , Patient's Chart, lab work & pertinent test results, reviewed documented beta blocker date and time   Airway Mallampati: II  TM Distance: >3 FB     Dental  (+) Chipped   Pulmonary former smoker,           Cardiovascular      Neuro/Psych PSYCHIATRIC DISORDERS Anxiety Depression Bipolar Disorder    GI/Hepatic   Endo/Other    Renal/GU      Musculoskeletal   Abdominal   Peds  Hematology   Anesthesia Other Findings   Reproductive/Obstetrics                             Anesthesia Physical Anesthesia Plan  ASA: II  Anesthesia Plan: Epidural   Post-op Pain Management:    Induction:   Airway Management Planned:   Additional Equipment:   Intra-op Plan:   Post-operative Plan:   Informed Consent: I have reviewed the patients History and Physical, chart, labs and discussed the procedure including the risks, benefits and alternatives for the proposed anesthesia with the patient or authorized representative who has indicated his/her understanding and acceptance.     Plan Discussed with:   Anesthesia Plan Comments:         Anesthesia Quick Evaluation

## 2015-02-20 NOTE — Progress Notes (Signed)
Reassessment cervically discussed with pt. Pt states she does not want exam now- "let me think about it"

## 2015-02-20 NOTE — Anesthesia Procedure Notes (Signed)
Epidural Patient location during procedure: OB  Staffing Anesthesiologist: Berdine AddisonHOMAS, Rebecca Hickman Performed by: anesthesiologist   Preanesthetic Checklist Completed: patient identified, site marked, surgical consent, pre-op evaluation, timeout performed, IV checked, risks and benefits discussed and monitors and equipment checked  Epidural Patient position: sitting Prep: Betadine Patient monitoring: heart rate, continuous pulse ox and blood pressure Approach: midline Location: L4-L5 Injection technique: LOR saline  Needle:  Needle type: Tuohy  Needle gauge: 18 G Needle length: 9 cm and 9 Catheter type: closed end flexible Catheter size: 20 Guage Test dose: negative and 1.5% lidocaine with Epi 1:200 K  Assessment Sensory level: T10 Events: blood not aspirated, injection not painful, no injection resistance, negative IV test and no paresthesia  Additional Notes   Patient tolerated the insertion well without complications. 1555 catheter. 1558 test. 1603 bolus. 1607 infusion.Reason for block:procedure for pain

## 2015-02-20 NOTE — Progress Notes (Signed)
Pt informed of d/c to her home. PT states she will call for a ride. States she wants to sleep. Declining sandwich tray/ crackers. Instructed to keep her next appointment. Pt declining further instructions/ discussion re labor precautions. Pt states she would like nurse to call Dr Valentino Saxonherry right away for a Tums order. Pt informed that she has received a d/c order and that same med maybe obtained at her pharmacy of choice or at this current time [facility]. Pt accepting of instructions.
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
## 2015-02-20 NOTE — Progress Notes (Signed)
Pt tolerating sips of fluid, no c/o's of nausea- total 240 cc cranberry juice ingested recently since arrival

## 2015-02-20 NOTE — Progress Notes (Signed)
Intrapartum Progress Note  S:   O: Blood pressure 121/81, pulse 94, temperature 97.9 F (36.6 C), temperature source Oral, resp. rate 16, height 5\' 7"  (1.702 m), weight 140 lb (63.504 kg). Gen App: NAD, comfortable Abdomen: soft, gravid FHT: baseline  120 bpm.  Accels present.  Decels absent. moderate in degree variability.   Tocometer: contractions q 1-3 minutes Cervix: 5-6/100/0/cephalic Extremities: Nontender, no edema.  Pitocin: 12 mIU  Labs:   Urinalysis    Component Value Date/Time   COLORURINE STRAW* 02/20/2015 1340   APPEARANCEUR CLEAR* 02/20/2015 1340   LABSPEC 1.003* 02/20/2015 1340   PHURINE 8.0 02/20/2015 1340   GLUCOSEU NEGATIVE 02/20/2015 1340   HGBUR 3+* 02/20/2015 1340   BILIRUBINUR NEGATIVE 02/20/2015 1340   BILIRUBINUR neg 02/16/2015 0806   KETONESUR NEGATIVE 02/20/2015 1340   PROTEINUR NEGATIVE 02/20/2015 1340   PROTEINUR neg 02/16/2015 0806   UROBILINOGEN 0.2 02/16/2015 0806   NITRITE NEGATIVE 02/20/2015 1340   NITRITE neg 02/16/2015 0806   LEUKOCYTESUR NEGATIVE 02/20/2015 1340      Assessment:  1: SIUP at 6147w2d 2. PROM 3. GBS neg  Plan:  1. Continue active management of labor with Pitocin 2. Anticipate vaginal delivery.     Hildred LaserAnika Lillyrose Reitan, MD 02/20/2015 3:33 PM

## 2015-02-20 NOTE — Progress Notes (Signed)
Pt stating she does not want to be reassessed vaginally to see if there is any change in her progress. Rationale reviewed with pt. Pt stating that procedure is too uncomfortable and she does not need it. Pt stating that she is wanting a meal tray, a full meal tray- not a measly little sandwich. Pt voicing that she is hungry and if nurse does not feed her, she might vomit. Pt declining antiemetic.Informed sandwich trays are the available item if able to eat. Pt states it is okay, whether Doctor will let her eat or not, she will have "someone" bring her some food.

## 2015-02-21 LAB — CBC
HCT: 26.5 % — ABNORMAL LOW (ref 35.0–47.0)
Hemoglobin: 8.8 g/dL — ABNORMAL LOW (ref 12.0–16.0)
MCH: 30 pg (ref 26.0–34.0)
MCHC: 33.1 g/dL (ref 32.0–36.0)
MCV: 90.7 fL (ref 80.0–100.0)
PLATELETS: 134 10*3/uL — AB (ref 150–440)
RBC: 2.93 MIL/uL — AB (ref 3.80–5.20)
RDW: 13.2 % (ref 11.5–14.5)
WBC: 18.8 10*3/uL — ABNORMAL HIGH (ref 3.6–11.0)

## 2015-02-21 LAB — RPR: RPR Ser Ql: NONREACTIVE

## 2015-02-21 MED ORDER — FERROUS SULFATE 325 (65 FE) MG PO TABS
325.0000 mg | ORAL_TABLET | Freq: Two times a day (BID) | ORAL | Status: DC
  Administered 2015-02-21 – 2015-02-22 (×2): 325 mg via ORAL
  Filled 2015-02-21 (×2): qty 1

## 2015-02-21 NOTE — Anesthesia Postprocedure Evaluation (Signed)
Anesthesia Post Note  Patient: Rebecca Hickman  Procedure(s) Performed: * No procedures listed *  Patient location during evaluation: Women's Unit Anesthesia Type: Epidural Level of consciousness: awake and alert and oriented Pain management: satisfactory to patient Vital Signs Assessment: post-procedure vital signs reviewed and stable Respiratory status: spontaneous breathing Cardiovascular status: stable Postop Assessment: No headache, No backache, No signs of nausea or vomiting and Spinal receding Anesthetic complications: no    Last Vitals:  Filed Vitals:   02/20/15 2357 02/21/15 0340  BP: 101/52 97/48  Pulse: 67 67  Temp: 36.8 C 36.7 C  Resp: 20 12    Last Pain:  Filed Vitals:   02/21/15 0643  PainSc: 8                  Willard Farquharson,  Margit BandaJohn M

## 2015-02-21 NOTE — OB Triage Provider Note (Signed)
L&D OB Triage Note  SUBJECTIVE Rebecca Hickman is a 24 y.o. 241P1001Louellen Hickman female at 4645w2d, EDD Estimated Date of Delivery: 03/04/15 who presented to triage with complaints of contractions.    OBJECTIVE Nursing Evaluation: BP 122/80 mmHg  Pulse 103  Temp(Src) 98.3 F (36.8 C) (Oral)  Resp 18  Ht 5\' 7"  (1.702 m)  Wt 138 lb (62.596 kg)  BMI 21.61 kg/m2 no significant findings for  Labor.  NST was performed and has been reviewed by me.  NST INTERPRETATION: Indications: contractions  Mode: External Baseline Rate (A): 140 bpm Variability: Moderate Accelerations: 15 x 15 Decelerations: None     Contraction Frequency (min): irregular   ASSESSMENT Impression:  1. Pregnancy:  G1P1001 at 7245w2d , EDD Estimated Date of Delivery: 03/04/15 2.  Reactive NST  PLAN 1. Reassurance given 2. Discharge home with bleeding/labor precautions.  3. Continue routine prenatal care.   Herold HarmsMartin A Danuta Huseman, MD

## 2015-02-21 NOTE — Progress Notes (Signed)
Post Partum Day #1  Subjective: no complaints, up ad lib, voiding and tolerating PO  Objective: Temp:  [97.7 F (36.5 C)-98.4 F (36.9 C)] 98.4 F (36.9 C) (11/28 0800) Pulse Rate:  [60-94] 60 (11/28 0800) Resp:  [12-20] 18 (11/28 0800) BP: (97-125)/(48-81) 104/60 mmHg (11/28 0800) SpO2:  [98 %-100 %] 100 % (11/28 0800)  Physical Exam:  General: alert and no distress Lochia: appropriate Uterine Fundus: firm Incision: None DVT Evaluation: No evidence of DVT seen on physical exam. Negative Homan's sign. No cords or calf tenderness. No significant calf/ankle edema.   Recent Labs  02/20/15 0549 02/21/15 0453  HGB 10.6* 8.8*  HCT 31.3* 26.5*    Assessment/Plan: Plan for discharge tomorrow, Breastfeeding, Social Work consult and Contraception undecided  Desires circumcision for female infant, can be performed outpatient.  Mild to moderate anemia- asymptomatic.  To treat with PO iron BID.   LOS: 1 day   [place] Hickman 02/21/2015, 9:27 AM

## 2015-02-22 MED ORDER — FERROUS SULFATE 325 (65 FE) MG PO TABS: 325.0000 mg | ORAL_TABLET | Freq: Two times a day (BID) | ORAL | Status: DC

## 2015-02-22 MED ORDER — IBUPROFEN 800 MG PO TABS
800.0000 mg | ORAL_TABLET | Freq: Four times a day (QID) | ORAL | Status: DC
Start: 2015-02-22 — End: 2015-06-16

## 2015-02-22 MED ORDER — DOCUSATE SODIUM 100 MG PO CAPS: 100.0000 mg | ORAL_CAPSULE | Freq: Two times a day (BID) | ORAL | Status: DC | PRN

## 2015-02-22 NOTE — Discharge Summary (Signed)
Obstetric Discharge Summary Reason for Admission: rupture of membranes Prenatal Procedures: ultrasound Intrapartum Procedures: spontaneous vaginal delivery Postpartum Procedures: none Complications-Operative and Postpartum: bilateral periurethral 1st degree laceration  CBC Latest Ref Rng 02/21/2015 02/20/2015 09/15/2014  WBC 3.6 - 11.0 K/uL 18.8(H) 14.4(H) -  Hemoglobin 12.0 - 16.0 g/dL 6.5(H8.8(L) 10.6(L) 11.4  Hematocrit 35.0 - 47.0 % 26.5(L) 31.3(L) 34  Platelets 150 - 440 K/uL 134(L) 143(L) 176    Physical Exam:  General: alert and no distress Lochia: appropriate Uterine Fundus: firm Incision: None DVT Evaluation: No evidence of DVT seen on physical exam. Negative Homan's sign. No cords or calf tenderness. No significant calf/ankle edema.  Discharge Diagnoses: Term Pregnancy-delivered and PROM x16 hours, Iron deficiency anemia  Discharge Information: Date: 02/22/2015 Activity: pelvic rest x 6 weeks Diet: routine Medications: PNV, Ibuprofen, Colace and Iron Condition: stable Instructions: refer to practice specific booklet Discharge to: home Follow-up Information    Follow up with Herold HarmsMartin A Defrancesco, MD In 6 weeks.   Specialties:  Obstetrics and Gynecology, Radiology   Why:  Postpartum visit   Contact information:   [address] [place] [address] [phone]       Newborn Data: Live born female  Birth Weight: 6 lb 13.9 oz (3115 g) APGAR: 7, 9  Home with mother.  [place] Hickman 02/22/2015, 7:53 AM

## 2015-02-22 NOTE — Clinical Social Work Maternal (Signed)
  CLINICAL SOCIAL WORK MATERNAL/CHILD NOTE  Patient Details  Name: [place] Hickman MRN: [id number] Date of Birth: 05/15/1990  Date:  02/22/2015  Clinical Social Worker Initiating Note:  Toma Copier, [place] Date/ Time Initiated:  02/22/15/1100     Child's Name:  [place] Hickman   Legal Guardian:  Mother   Need for Interpreter:  None   Date of Referral:  02/21/15     Reason for Referral:  Homelessness    Referral Source:  RN   Address:  [address] [place]   Phone number:  [phone]   Household Members:   (shelter resident - Energy manager)   Natural Supports (not living in the home):  Friends   Professional Supports: Case Metallurgist, Systems developer, Transport planner   Employment: Unemployed   Type of Work: Hx of Press photographer, Pharmacologist, Therapist, sports:  Diplomatic Services operational officer Resources:  Medicaid, Multimedia programmer   Other Resources:  Physicist, medical , [place] Considerations Which May Impact Care:  none   Strengths:  Compliance with medical plan , Understanding of illness, Pediatrician chosen    Risk Factors/Current Problems:  Basic Needs , Family/Relationship Issues , Mental Health Concerns    Cognitive State:  Alert , Insightful    Mood/Affect:  Anxious , Irritable    CSW Assessment: CSW was referred to Pt due to homelessness and need for many resources. CSW met with Pt alone in her room on day of discharge to complete assessment. Pt reports that she is single, has moved back and forth between [place] the last few years. She shares that she has a mother in [place] who reportedly offered to let her stay with her but when Pt moved up here she then said no. Pt and FOB are no longer boyfriend and girlfriend, though they still do communicate regarding the pregnancy. Pt states that she has been sick the whole pregnancy and unable to work. She moved into the [facility] shelter [facility] in [place] in October. She is  able to stay there until she gets back on her feet, which she expects will be once she begins working. 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 Pt has a therapist who she has seen before but plans on following up with at discharge. Pt was being seen for PTSD related symptoms. She was on medication prior to pregnancy, but discontinued them once she realized she was pregnant. Pt has professional case management services from the shelter and [facility]. Pt was unable to obtain a carseat from the fire department prior to coming to hospital, so CSW provided appropriate car seat. Pt confirmed that she does have a pack-n-play in her room from the baby shower the shelter gave her. CSW also provided Pt with additional baby items available at the hospital.  CSW offered Pt support and encouragement. She is goal oriented and has contacted necessary supports to aid her along the way. FOB not financially supportive at this time, but working and will have some income soon. No CSW concerns for dc.   CSW Plan/Description:  Information/Referral to [employer] , No Further Intervention Required/No Barriers to Discharge, Patient/Family Education     Alonna Buckler, LCSW 02/22/2015, 1:50 PM

## 2015-02-22 NOTE — Progress Notes (Signed)
Patient discharged home with infant. Vital signs stable, bleeding within normal limits, uterus firm. Discharge instructions, prescriptions, and follow up appointment given to and reviewed with patient. Patient verbalized understanding, all questions answered. Escorted in wheelchair by nursing.    

## 2015-02-22 NOTE — Discharge Instructions (Signed)

## 2015-02-22 NOTE — Progress Notes (Signed)
Post Partum Day #2  Subjective: no complaints, up ad lib, voiding and tolerating PO  Objective: Blood pressure 100/53, pulse 62, temperature 98.5 F (36.9 C), temperature source Oral, resp. rate 18, height 5\' 7"  (1.702 m), weight 140 lb (63.504 kg), SpO2 100 %, unknown if currently breastfeeding.  Physical Exam:  General: alert and no distress Lochia: appropriate Uterine Fundus: firm Incision: None DVT Evaluation: No evidence of DVT seen on physical exam. Negative Homan's sign. No cords or calf tenderness. No significant calf/ankle edema.   Recent Labs  02/20/15 0549 02/21/15 0453  HGB 10.6* 8.8*  HCT 31.3* 26.5*    Assessment/Plan: Discharge home, Breastfeeding, Social Work consult and Contraception undecided  Desires circumcision for female infant, can be performed outpatient.  Mild to moderate anemia- asymptomatic.  To treat with PO iron BID.   LOS: 2 days   Hildred Lasernika Khloi Rawl 02/22/2015, 7:48 AM

## 2015-02-23 ENCOUNTER — Encounter: Payer: 59 | Admitting: Obstetrics and Gynecology

## 2015-06-16 ENCOUNTER — Encounter: Payer: Self-pay | Admitting: Obstetrics and Gynecology

## 2015-06-16 ENCOUNTER — Ambulatory Visit (INDEPENDENT_AMBULATORY_CARE_PROVIDER_SITE_OTHER): Payer: 59 | Admitting: Obstetrics and Gynecology

## 2015-06-16 VITALS — BP 131/82 | HR 92 | Ht 67.0 in | Wt 118.6 lb

## 2015-06-16 DIAGNOSIS — M62830 Muscle spasm of back: Secondary | ICD-10-CM

## 2015-06-16 DIAGNOSIS — F419 Anxiety disorder, unspecified: Secondary | ICD-10-CM

## 2015-06-16 MED ORDER — CYCLOBENZAPRINE HCL 10 MG PO TABS: 10.0000 mg | ORAL_TABLET | Freq: Three times a day (TID) | ORAL | Status: AC | PRN

## 2015-06-16 NOTE — Patient Instructions (Signed)
1. Flexeril 10 mg 3 times a day is written for muscle spasm is 2. Physical therapy consult will be made for management back pain 3. Referral to psychiatry will need for anxiety medication management-Dr Maryruth BunKapur 4. Return on 07/06/2014 Mirena IUD insertion
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
## 2015-07-07 ENCOUNTER — Ambulatory Visit: Payer: 59 | Admitting: Obstetrics and Gynecology

## 2016-02-12 IMAGING — US US OB LIMITED
1 series · 14 of 28 positions shown · non-contrast
Comparison: none

CLINICAL DATA: Low lying placenta

EXAM:
LIMITED OBSTETRIC ULTRASOUND

[Series 1: us ob limited · 0.25mm/px · 14 of 29 slices shown]
[im 2/29]
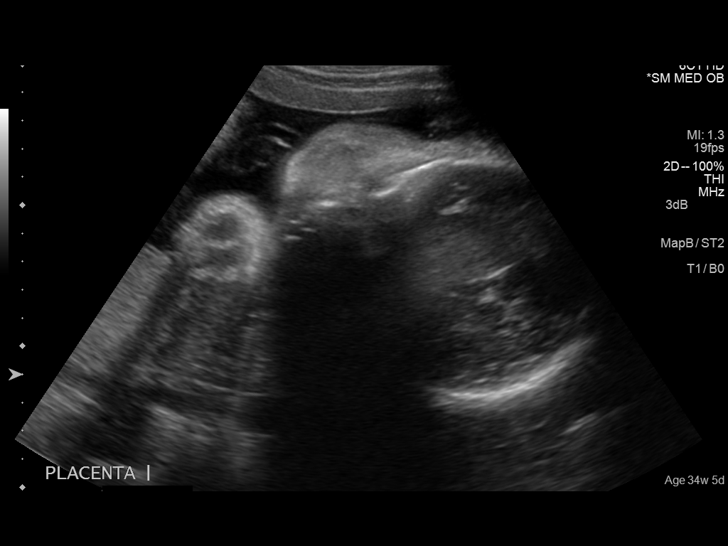
[im 4/29]
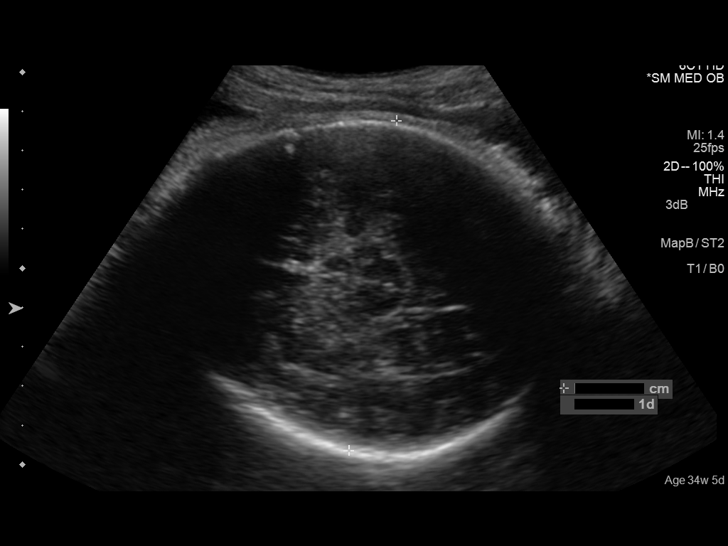
[im 6/29]
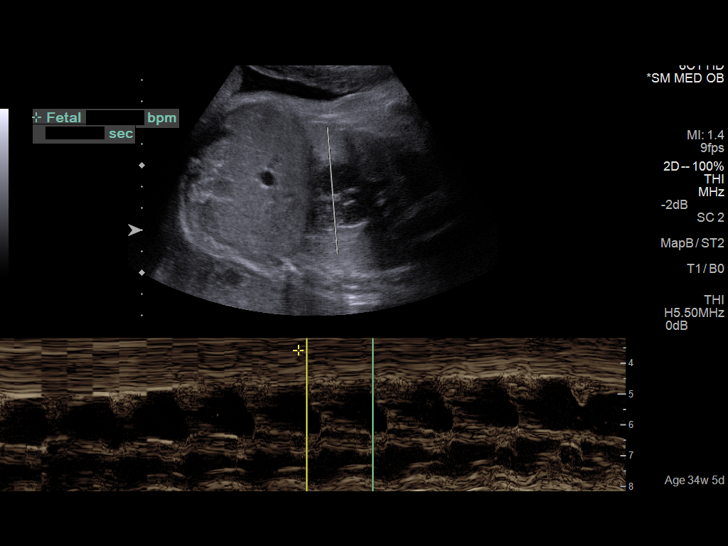
[im 8/29]
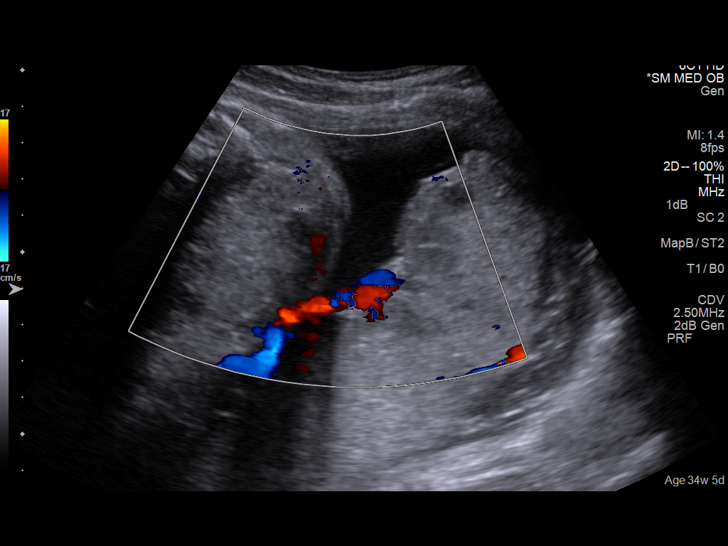
[im 10/29]
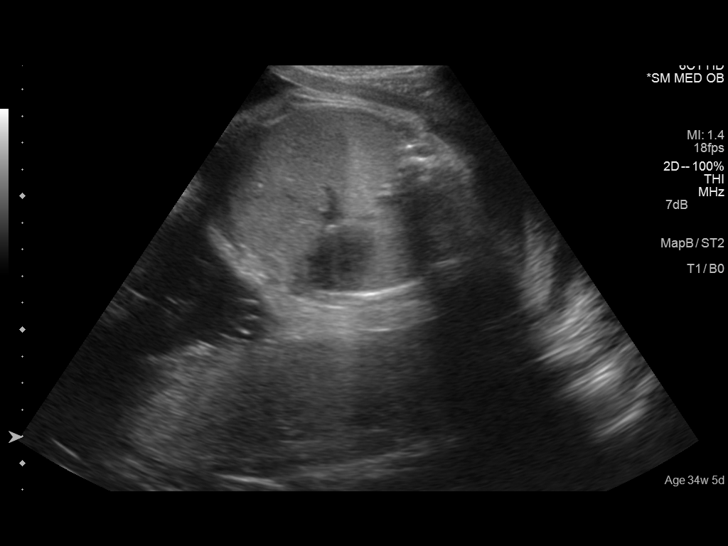
[im 12/29]
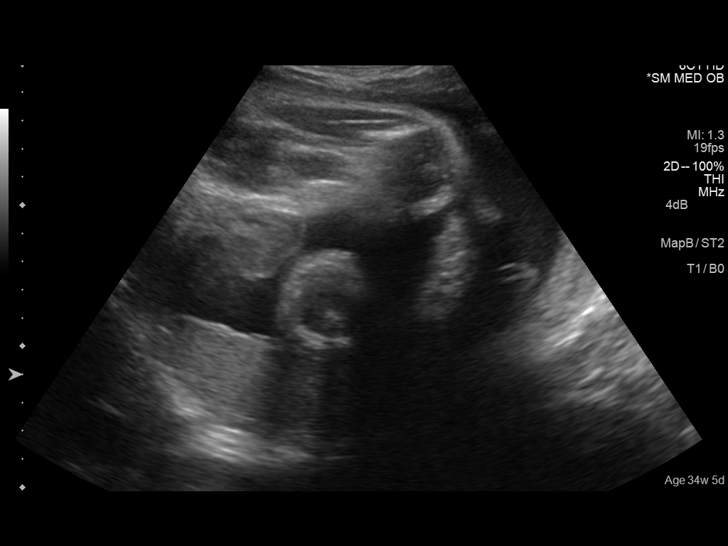
[im 14/29]
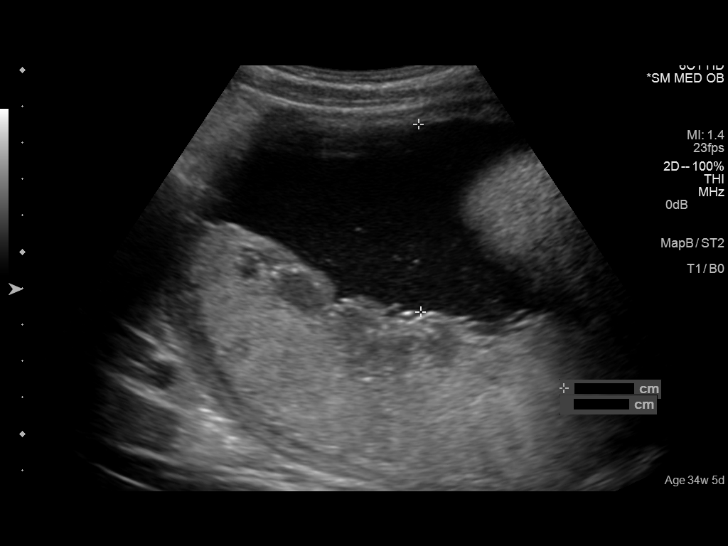
[im 16/29]
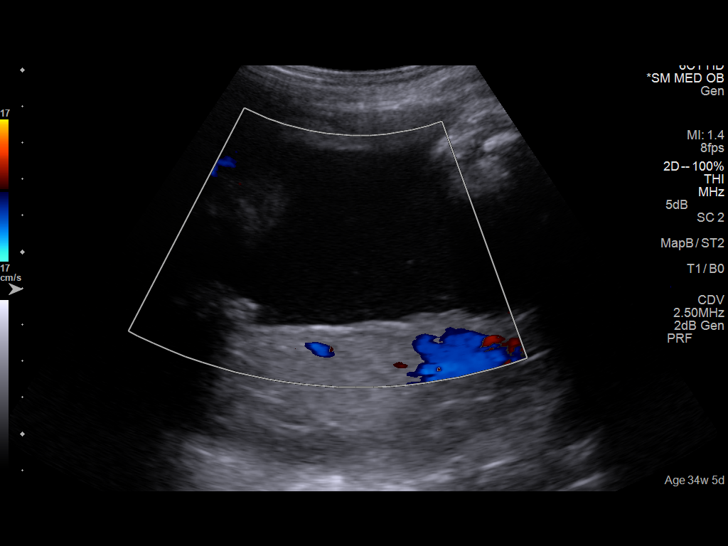
[im 18/29]
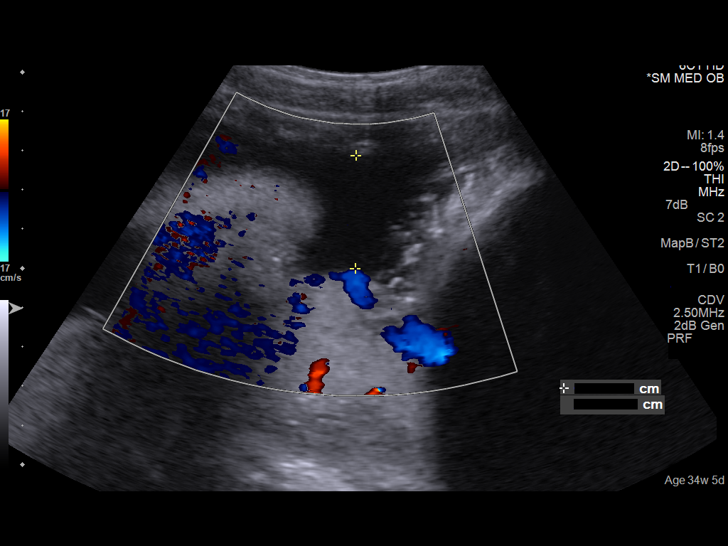
[im 20/29]
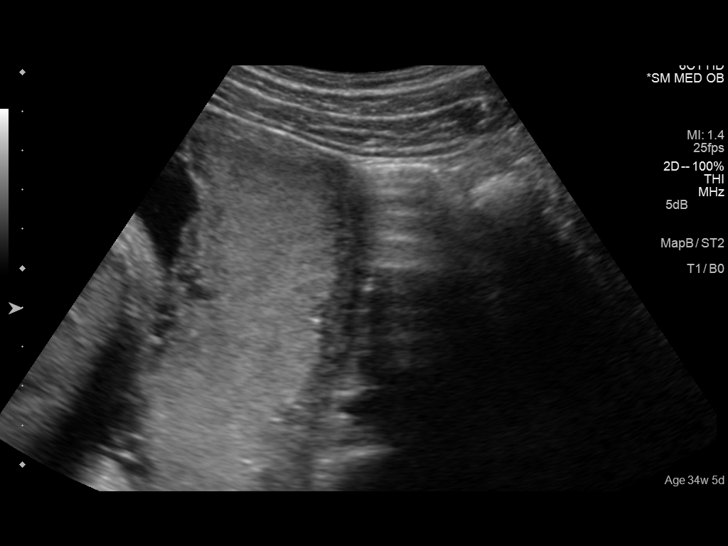
[im 22/29]
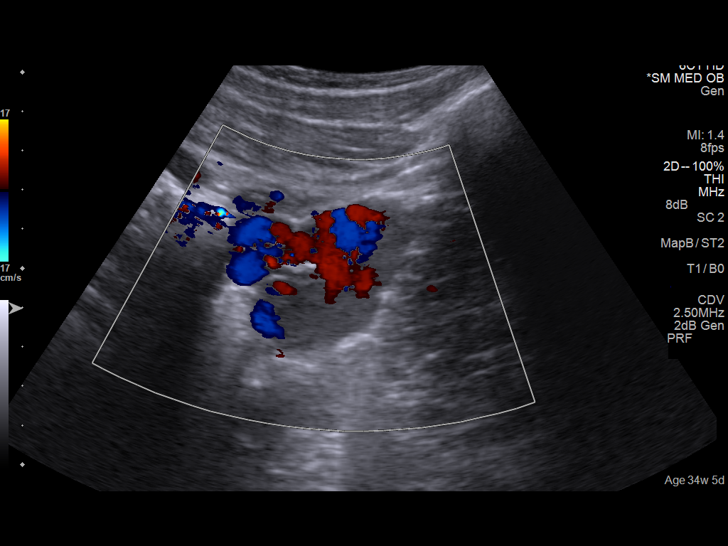
[im 24/29]
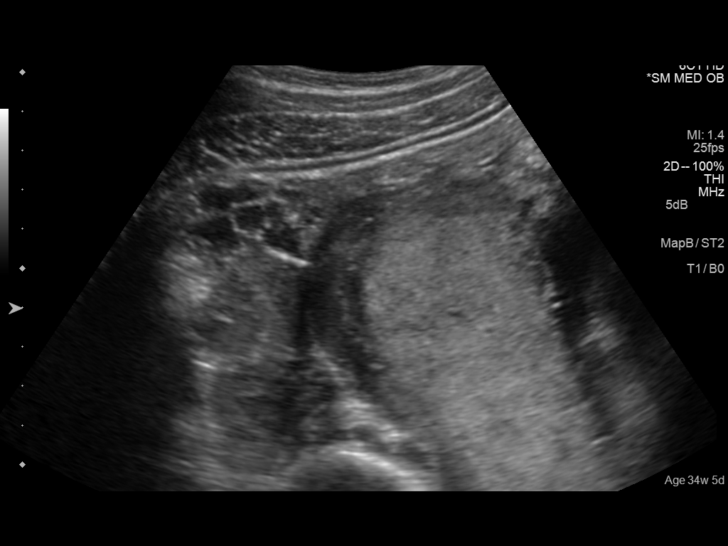
[im 26/29]
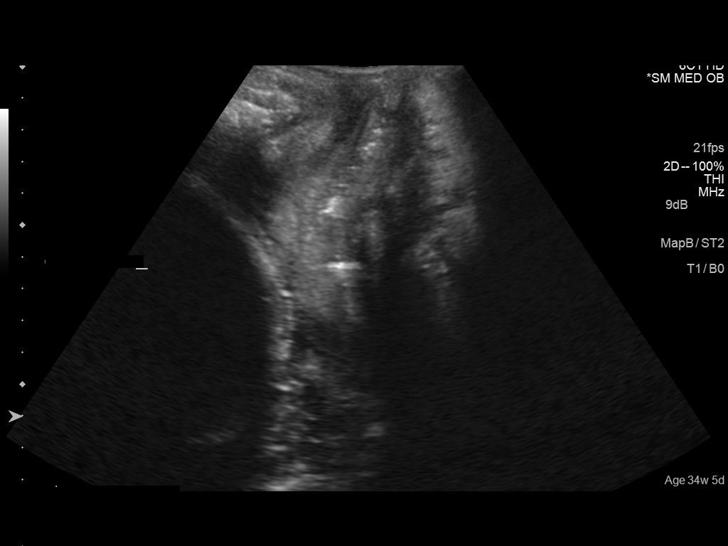
[im 29/29]
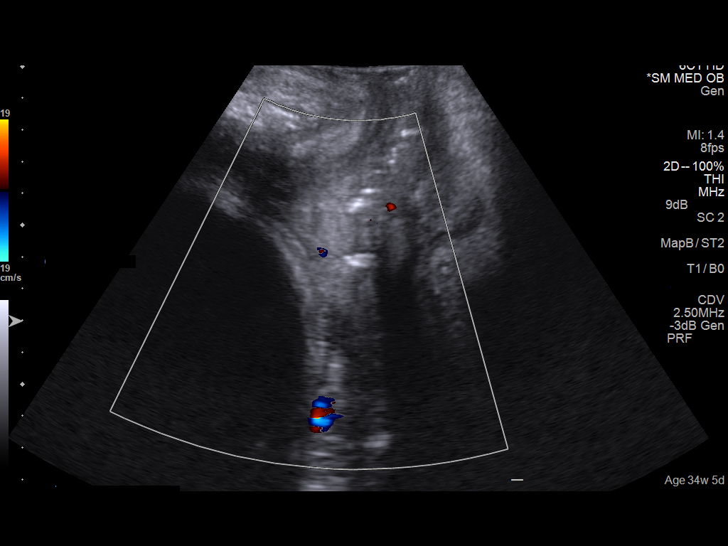

[14 of 28 positions shown; findings below may reference images not displayed]

FINDINGS: Number of Fetuses: 1

Heart Rate:  155 bpm

Movement: Yes

Presentation: Cephalic

Placental Location: Posterior

Previa: No: Distance to internal os 6 cm approximately

Amniotic Fluid (Subjective):  Within normal limits.  AFI: 15.9 cm

BPD:  8.49cm 34w  1d

MATERNAL FINDINGS:

Cervix:  Appears closed.

Uterus/Adnexae:  No abnormality visualized.
IMPRESSION: No evidence of placenta previa.

This exam is performed on an emergent basis and does not
comprehensively evaluate fetal size, dating, or anatomy; follow-up
complete OB US should be considered if further fetal assessment is
warranted.
